# Patient Record
Sex: Female | Born: 1960 | Race: Black or African American | Hispanic: No | Marital: Single | Smoking: Never smoker
Health system: Southern US, Community
[De-identification: ages and names within clinical notes are randomized; demographics above are authoritative.]

## PROBLEM LIST (undated history)

## (undated) DIAGNOSIS — E119 Type 2 diabetes mellitus without complications: Secondary | ICD-10-CM

## (undated) DIAGNOSIS — L439 Lichen planus, unspecified: Secondary | ICD-10-CM

## (undated) DIAGNOSIS — M1990 Unspecified osteoarthritis, unspecified site: Secondary | ICD-10-CM

## (undated) DIAGNOSIS — E1110 Type 2 diabetes mellitus with ketoacidosis without coma: Secondary | ICD-10-CM

## (undated) HISTORY — PX: COLONOSCOPY: SHX174

## (undated) SURGERY — ARTHROPLASTY, HIP, TOTAL, ANTERIOR APPROACH
Anesthesia: Choice | Laterality: Right

---

## 2014-08-09 HISTORY — PX: KNEE ARTHROSCOPY: SHX127

## 2014-11-20 DIAGNOSIS — E1110 Type 2 diabetes mellitus with ketoacidosis without coma: Secondary | ICD-10-CM

## 2014-11-20 HISTORY — DX: Type 2 diabetes mellitus with ketoacidosis without coma: E11.10

## 2015-04-13 ENCOUNTER — Other Ambulatory Visit: Payer: Self-pay | Admitting: Orthopedic Surgery

## 2015-04-24 ENCOUNTER — Encounter (HOSPITAL_COMMUNITY): Payer: Self-pay

## 2015-04-24 ENCOUNTER — Encounter (HOSPITAL_COMMUNITY)
Admission: RE | Admit: 2015-04-24 | Discharge: 2015-04-24 | Disposition: A | Discharge status: Home / Self Care | Payer: 59 | Source: Ambulatory Visit | Attending: Orthopedic Surgery | Admitting: Orthopedic Surgery

## 2015-04-24 DIAGNOSIS — M1611 Unilateral primary osteoarthritis, right hip: Secondary | ICD-10-CM | POA: Diagnosis not present

## 2015-04-24 DIAGNOSIS — Z01812 Encounter for preprocedural laboratory examination: Secondary | ICD-10-CM | POA: Insufficient documentation

## 2015-04-24 DIAGNOSIS — E119 Type 2 diabetes mellitus without complications: Secondary | ICD-10-CM | POA: Diagnosis not present

## 2015-04-24 DIAGNOSIS — Z0183 Encounter for blood typing: Secondary | ICD-10-CM | POA: Insufficient documentation

## 2015-04-24 DIAGNOSIS — Z79899 Other long term (current) drug therapy: Secondary | ICD-10-CM | POA: Diagnosis not present

## 2015-04-24 DIAGNOSIS — Z794 Long term (current) use of insulin: Secondary | ICD-10-CM | POA: Diagnosis not present

## 2015-04-24 DIAGNOSIS — Z01818 Encounter for other preprocedural examination: Secondary | ICD-10-CM | POA: Insufficient documentation

## 2015-04-24 HISTORY — DX: Unspecified osteoarthritis, unspecified site: M19.90

## 2015-04-24 HISTORY — DX: Type 2 diabetes mellitus with ketoacidosis without coma: E11.10

## 2015-04-24 HISTORY — DX: Type 2 diabetes mellitus without complications: E11.9

## 2015-04-24 HISTORY — DX: Lichen planus, unspecified: L43.9

## 2015-04-24 LAB — CBC WITH DIFFERENTIAL/PLATELET
BASOS ABS: 0.1 10*3/uL (ref 0.0–0.1)
Basophils Relative: 1 %
Eosinophils Absolute: 0.3 10*3/uL (ref 0.0–0.7)
Eosinophils Relative: 3 %
HEMATOCRIT: 44.5 % (ref 36.0–46.0)
HEMOGLOBIN: 14.3 g/dL (ref 12.0–15.0)
LYMPHS ABS: 2.9 10*3/uL (ref 0.7–4.0)
LYMPHS PCT: 26 %
MCH: 25.6 pg — AB (ref 26.0–34.0)
MCHC: 32.1 g/dL (ref 30.0–36.0)
MCV: 79.7 fL (ref 78.0–100.0)
Monocytes Absolute: 0.6 10*3/uL (ref 0.1–1.0)
Monocytes Relative: 6 %
NEUTROS ABS: 7.1 10*3/uL (ref 1.7–7.7)
NEUTROS PCT: 64 %
PLATELETS: 329 10*3/uL (ref 150–400)
RBC: 5.58 MIL/uL — AB (ref 3.87–5.11)
RDW: 15.6 % — ABNORMAL HIGH (ref 11.5–15.5)
WBC: 10.9 10*3/uL — AB (ref 4.0–10.5)

## 2015-04-24 LAB — COMPREHENSIVE METABOLIC PANEL
ALK PHOS: 82 U/L (ref 38–126)
ALT: 19 U/L (ref 14–54)
AST: 18 U/L (ref 15–41)
Albumin: 4 g/dL (ref 3.5–5.0)
Anion gap: 9 (ref 5–15)
BUN: 17 mg/dL (ref 6–20)
CALCIUM: 10.5 mg/dL — AB (ref 8.9–10.3)
CHLORIDE: 106 mmol/L (ref 101–111)
CO2: 26 mmol/L (ref 22–32)
CREATININE: 0.73 mg/dL (ref 0.44–1.00)
Glucose, Bld: 151 mg/dL — ABNORMAL HIGH (ref 65–99)
Potassium: 4.2 mmol/L (ref 3.5–5.1)
Sodium: 141 mmol/L (ref 135–145)
Total Bilirubin: 0.4 mg/dL (ref 0.3–1.2)
Total Protein: 7 g/dL (ref 6.5–8.1)

## 2015-04-24 LAB — URINE MICROSCOPIC-ADD ON: Bacteria, UA: NONE SEEN

## 2015-04-24 LAB — TYPE AND SCREEN
ABO/RH(D): O POS
Antibody Screen: NEGATIVE

## 2015-04-24 LAB — ABO/RH: ABO/RH(D): O POS

## 2015-04-24 LAB — URINALYSIS, ROUTINE W REFLEX MICROSCOPIC
Bilirubin Urine: NEGATIVE
HGB URINE DIPSTICK: NEGATIVE
KETONES UR: NEGATIVE mg/dL
Leukocytes, UA: NEGATIVE
Nitrite: NEGATIVE
PROTEIN: NEGATIVE mg/dL
Specific Gravity, Urine: 1.026 (ref 1.005–1.030)
pH: 7 (ref 5.0–8.0)

## 2015-04-24 LAB — GLUCOSE, CAPILLARY: GLUCOSE-CAPILLARY: 140 mg/dL — AB (ref 65–99)

## 2015-04-24 LAB — PROTIME-INR
INR: 0.94 (ref 0.00–1.49)
PROTHROMBIN TIME: 12.8 s (ref 11.6–15.2)

## 2015-04-24 LAB — APTT: APTT: 33 s (ref 24–37)

## 2015-04-24 LAB — SURGICAL PCR SCREEN
MRSA, PCR: NEGATIVE
Staphylococcus aureus: NEGATIVE

## 2015-04-24 NOTE — Progress Notes (Signed)
Ms April Townsend reports that she only checks CBGs in am- run 110- 140.  [place] in [place] with D[place]. Patient reports that she had a new oral diabetic medication added after that.  I encouraged patient to check CBG in the upcoming days prior to surgery and if any big change from what she reported today (110- 140 to call Dr Sharl MaKerr.  Patient denies chest pain or shortness of breath.  Patient had an echo and stress test in 2014- "I was having chest discomfort, the test was normal. They think it was muscle spasms or stress.

## 2015-04-24 NOTE — Pre-Procedure Instructions (Signed)
April Townsend  04/24/2015    Your procedure is scheduled on Friday, March 17.  Report to [facility] Admitting at 7:15 A.M.                Your surgery or procedure is scheduled for 9:15 AM   Call this number if you have problems the morning of surgery:[phone]                       For any other questions, please call [phone], Monday - Friday 8 AM - 4 PM.   Remember:  Do not eat food or drink liquids after midnight Thursday               Take these medicines the morning of surgery with A SIP OF WATER :  None                         How to Manage Your Diabetes Before Surgery  What do I do about my diabetes medications?  Do not take oral diabetes medicines (pills) the morning of surgery.   THE MORNING OF SURGERY, take 37 units of  Toujeo  Insulin.   Do not take other diabetes injectables the day of surgery including Byetta, Victoza, Bydureon, and Trulicity.  Why is it important to control my blood sugar before and after surgery?   Improving blood sugar levels before and after surgery helps healing and can limit problems.  A way of improving blood sugar control is eating a healthy diet by:  - Eating less sugar and carbohydrates  - Increasing activity/exercise  - Talk with your doctor about reaching your blood sugar goals  High blood sugars (greater than 180 mg/dL) can raise your risk of infections and slow down your recovery so you will need to focus on controlling your diabetes during the weeks before surgery.  Make sure that the doctor who takes care of your diabetes knows about your planned surgery including the date and location.  How do I manage my blood sugars before surgery?   Check your blood sugar at least 4 times a day, 2 days before surgery to make sure that they are not too high or low.   Check your blood sugar the morning of your surgery when you wake up and every 2  hours until you get to the Short-Stay unit.  If your blood sugar is  less than 70 mg/dL, you will need to treat for low blood sugar by:  Treat a low blood sugar (less than 70 mg/dL) with 1/2 cup of clear juice (cranberry or apple), 4 glucose tablets, OR glucose gel.  Recheck blood sugar in 15 minutes after treatment (to make sure it is greater than 70 mg/dL).  If blood sugar is not greater than 70 mg/dL on re-check, call [phone] for further instructions.   Report your blood sugar to the Short-Stay nurse when you get to Short-Stay.  References:  [facility], 2007 "How to Manage your Diabetes Before and After Surgery".    Do not wear jewelry, make-up or nail polish.  Do not wear lotions, powders, or perfumes.    Do not shave 48 hours prior to surgery.  Do not bring valuables to the hospital.  [facility] is not responsible for any belongings or valuables.  Contacts, dentures or bridgework may not be worn into surgery.  Leave your suitcase in the car.  After surgery  it may be brought to your room.  For patients admitted to the hospital, discharge time will be determined by your treatment team.  Special instructions:  Review  [place] - Preparing For Surgery.   Please read over the following fact sheets that you were given. Pain Booklet, Coughing and Deep Breathing, Blood Transfusion Information, MRSA Information and Surgical Site Infection Prevention  Incentive Spirometery

## 2015-04-25 NOTE — Progress Notes (Addendum)
Anesthesia Chart Review:  Pt is a 55 year old female scheduled for R total hip arthroplasty anterior approach on 05/04/2015 with Dr. Luiz BlareGraves.   PCP is Dr. Karyl KinnierArlene Ramos who has cleared pt medically for surgery (care everywhere note 04/13/15)  PMH includes:  DM (DKA 11/2014). Never smoker. BMI 32.5  Medications include: farxiga, toujeo, lisinopril, sitagliptin  Preoperative labs reviewed. Hgb A1c was 8.4 on 04/06/15 (care everywhere).   Chest x-ray report 11/20/14 reviewed (care everywhere). No acute cardiopulmonary disease.   EKG 04/24/15: NSR. Cannot rule out Anterior infarct, age undetermined. Low R V3 of unknown significance per Dr. Thomasene LotGreen's interpretation.   By notes, pt saw cardiologist Dr. Gwen HerUsman Khawaja (care everywhere) for atypical chest pain. Stress test and echo normal. Have requested copies of tests for our records.   Rica Mastngela Colleene Swarthout, FNP-BC [facility]/Anesthesiology Phone: [phone] 04/25/2015 11:55 AM  Addendum:   Echo 10/06/12:  - EF >55% - LV wall motion normal - Borderline MVP - Mild tricuspid regurgitation  Nuclear stress test 09/15/12:  - No inducible myocardial ischemia.  - EF 58%. Global LV systolic function normal   If no changes, I anticipate pt can proceed with surgery as scheduled.   Rica Mastngela Jona Zappone, FNP-BC [facility]/Anesthesiology Phone: [phone] 04/30/2015 12:55 PM

## 2015-04-27 NOTE — Progress Notes (Signed)
Message left with [facility] medical records re-requesting stress test and echo.

## 2015-05-03 MED ORDER — CLINDAMYCIN PHOSPHATE 900 MG/50ML IV SOLN
900.0000 mg | INTRAVENOUS | Status: AC
  Administered 2015-05-04: 900 mg via INTRAVENOUS
  Filled 2015-05-03: qty 50

## 2015-05-04 ENCOUNTER — Inpatient Hospital Stay (HOSPITAL_COMMUNITY): Payer: 59 | Admitting: Anesthesiology

## 2015-05-04 ENCOUNTER — Encounter (HOSPITAL_COMMUNITY)
Admission: RE | Disposition: A | Discharge status: Home Health | Payer: Self-pay | Source: Ambulatory Visit | Attending: Orthopedic Surgery

## 2015-05-04 ENCOUNTER — Inpatient Hospital Stay (HOSPITAL_COMMUNITY): Payer: 59

## 2015-05-04 ENCOUNTER — Encounter (HOSPITAL_COMMUNITY): Payer: Self-pay | Admitting: *Deleted

## 2015-05-04 ENCOUNTER — Inpatient Hospital Stay (HOSPITAL_COMMUNITY)
Admission: RE | Admit: 2015-05-04 | Discharge: 2015-05-05 | DRG: 470 | Disposition: A | Discharge status: Home Health | Payer: 59 | Source: Ambulatory Visit | Attending: Orthopedic Surgery | Admitting: Orthopedic Surgery

## 2015-05-04 ENCOUNTER — Inpatient Hospital Stay (HOSPITAL_COMMUNITY): Payer: 59 | Admitting: Emergency Medicine

## 2015-05-04 DIAGNOSIS — Z419 Encounter for procedure for purposes other than remedying health state, unspecified: Secondary | ICD-10-CM

## 2015-05-04 DIAGNOSIS — I10 Essential (primary) hypertension: Secondary | ICD-10-CM | POA: Diagnosis present

## 2015-05-04 DIAGNOSIS — D62 Acute posthemorrhagic anemia: Secondary | ICD-10-CM | POA: Diagnosis not present

## 2015-05-04 DIAGNOSIS — Z794 Long term (current) use of insulin: Secondary | ICD-10-CM

## 2015-05-04 DIAGNOSIS — E119 Type 2 diabetes mellitus without complications: Secondary | ICD-10-CM | POA: Diagnosis present

## 2015-05-04 DIAGNOSIS — M247 Protrusio acetabuli: Secondary | ICD-10-CM | POA: Diagnosis present

## 2015-05-04 DIAGNOSIS — Z01818 Encounter for other preprocedural examination: Secondary | ICD-10-CM

## 2015-05-04 DIAGNOSIS — M1611 Unilateral primary osteoarthritis, right hip: Secondary | ICD-10-CM | POA: Diagnosis present

## 2015-05-04 HISTORY — PX: TOTAL HIP ARTHROPLASTY: SHX124

## 2015-05-04 LAB — GLUCOSE, CAPILLARY
GLUCOSE-CAPILLARY: 141 mg/dL — AB (ref 65–99)
GLUCOSE-CAPILLARY: 55 mg/dL — AB (ref 65–99)
Glucose-Capillary: 104 mg/dL — ABNORMAL HIGH (ref 65–99)
Glucose-Capillary: 130 mg/dL — ABNORMAL HIGH (ref 65–99)
Glucose-Capillary: 135 mg/dL — ABNORMAL HIGH (ref 65–99)

## 2015-05-04 SURGERY — ARTHROPLASTY, HIP, TOTAL, ANTERIOR APPROACH
Anesthesia: Spinal | Laterality: Right

## 2015-05-04 MED ORDER — BISACODYL 5 MG PO TBEC: 5.0000 mg | DELAYED_RELEASE_TABLET | Freq: Every day | ORAL | Status: DC | PRN

## 2015-05-04 MED ORDER — TRANEXAMIC ACID 1000 MG/10ML IV SOLN
1000.0000 mg | Freq: Once | INTRAVENOUS | Status: AC
  Administered 2015-05-04: 1000 mg via INTRAVENOUS
  Filled 2015-05-04: qty 10

## 2015-05-04 MED ORDER — HYDROMORPHONE HCL 1 MG/ML IJ SOLN
0.5000 mg | INTRAMUSCULAR | Status: DC | PRN
Start: 2015-05-04 — End: 2015-05-05

## 2015-05-04 MED ORDER — ACETAMINOPHEN 650 MG RE SUPP: 650.0000 mg | Freq: Four times a day (QID) | RECTAL | Status: DC | PRN

## 2015-05-04 MED ORDER — ASPIRIN EC 325 MG PO TBEC: 325.0000 mg | DELAYED_RELEASE_TABLET | Freq: Two times a day (BID) | ORAL | Status: AC

## 2015-05-04 MED ORDER — OXYCODONE HCL 5 MG PO TABS
5.0000 mg | ORAL_TABLET | ORAL | Status: DC | PRN
  Administered 2015-05-04 – 2015-05-05 (×3): 10 mg via ORAL
  Filled 2015-05-04 (×3): qty 2

## 2015-05-04 MED ORDER — DOCUSATE SODIUM 100 MG PO CAPS
100.0000 mg | ORAL_CAPSULE | Freq: Two times a day (BID) | ORAL | Status: DC
  Administered 2015-05-04 – 2015-05-05 (×3): 100 mg via ORAL
  Filled 2015-05-04 (×3): qty 1

## 2015-05-04 MED ORDER — ESMOLOL HCL 100 MG/10ML IV SOLN
INTRAVENOUS | Status: DC | PRN
Start: 2015-05-04 — End: 2015-05-04
  Administered 2015-05-04 (×3): 10 mg via INTRAVENOUS

## 2015-05-04 MED ORDER — MAGNESIUM CITRATE PO SOLN: 1.0000 | Freq: Once | ORAL | Status: DC | PRN

## 2015-05-04 MED ORDER — HYDROMORPHONE HCL 1 MG/ML IJ SOLN
INTRAMUSCULAR | Status: AC
  Filled 2015-05-04: qty 1

## 2015-05-04 MED ORDER — LIDOCAINE HCL (CARDIAC) 20 MG/ML IV SOLN
INTRAVENOUS | Status: AC
  Filled 2015-05-04: qty 5

## 2015-05-04 MED ORDER — MIDAZOLAM HCL 2 MG/2ML IJ SOLN
INTRAMUSCULAR | Status: AC
  Filled 2015-05-04: qty 2

## 2015-05-04 MED ORDER — LABETALOL HCL 5 MG/ML IV SOLN
INTRAVENOUS | Status: AC
  Filled 2015-05-04: qty 4

## 2015-05-04 MED ORDER — LINAGLIPTIN 5 MG PO TABS
5.0000 mg | ORAL_TABLET | Freq: Every day | ORAL | Status: DC
  Administered 2015-05-04 – 2015-05-05 (×2): 5 mg via ORAL
  Filled 2015-05-04 (×2): qty 1

## 2015-05-04 MED ORDER — LABETALOL HCL 5 MG/ML IV SOLN: 10.0000 mg | INTRAVENOUS | Status: DC | PRN

## 2015-05-04 MED ORDER — BUPIVACAINE HCL 0.5 % IJ SOLN
INTRAMUSCULAR | Status: DC | PRN
  Administered 2015-05-04: 20 mL

## 2015-05-04 MED ORDER — INSULIN GLARGINE 100 UNIT/ML ~~LOC~~ SOLN
37.0000 [IU] | Freq: Every day | SUBCUTANEOUS | Status: DC
Start: 2015-05-05 — End: 2015-05-05
  Administered 2015-05-05: 37 [IU] via SUBCUTANEOUS
  Filled 2015-05-04: qty 0.37

## 2015-05-04 MED ORDER — LACTATED RINGERS IV SOLN
INTRAVENOUS | Status: DC
  Administered 2015-05-04 (×3): via INTRAVENOUS

## 2015-05-04 MED ORDER — SODIUM CHLORIDE 0.9 % IV SOLN: INTRAVENOUS | Status: DC

## 2015-05-04 MED ORDER — INSULIN ASPART 100 UNIT/ML ~~LOC~~ SOLN
0.0000 [IU] | Freq: Three times a day (TID) | SUBCUTANEOUS | Status: DC
  Administered 2015-05-04: 2 [IU] via SUBCUTANEOUS

## 2015-05-04 MED ORDER — CANAGLIFLOZIN 100 MG PO TABS
100.0000 mg | ORAL_TABLET | Freq: Every day | ORAL | Status: DC
  Administered 2015-05-05: 100 mg via ORAL
  Filled 2015-05-04: qty 1

## 2015-05-04 MED ORDER — ASPIRIN EC 325 MG PO TBEC
325.0000 mg | DELAYED_RELEASE_TABLET | Freq: Two times a day (BID) | ORAL | Status: DC
  Administered 2015-05-04 – 2015-05-05 (×2): 325 mg via ORAL
  Filled 2015-05-04 (×2): qty 1

## 2015-05-04 MED ORDER — ALUM & MAG HYDROXIDE-SIMETH 200-200-20 MG/5ML PO SUSP: 30.0000 mL | ORAL | Status: DC | PRN

## 2015-05-04 MED ORDER — MIDAZOLAM HCL 5 MG/5ML IJ SOLN
INTRAMUSCULAR | Status: DC | PRN
  Administered 2015-05-04: 2 mg via INTRAVENOUS

## 2015-05-04 MED ORDER — ESMOLOL HCL 100 MG/10ML IV SOLN
INTRAVENOUS | Status: AC
  Filled 2015-05-04: qty 10

## 2015-05-04 MED ORDER — BUPIVACAINE IN DEXTROSE 0.75-8.25 % IT SOLN
INTRATHECAL | Status: DC | PRN
  Administered 2015-05-04: 2 mL via INTRATHECAL

## 2015-05-04 MED ORDER — PHENYLEPHRINE HCL 10 MG/ML IJ SOLN
10.0000 mg | INTRAVENOUS | Status: DC | PRN
  Administered 2015-05-04: 10 ug/min via INTRAVENOUS

## 2015-05-04 MED ORDER — KETOROLAC TROMETHAMINE 15 MG/ML IJ SOLN
15.0000 mg | Freq: Three times a day (TID) | INTRAMUSCULAR | Status: AC
  Administered 2015-05-04 – 2015-05-05 (×4): 15 mg via INTRAVENOUS
  Filled 2015-05-04 (×4): qty 1

## 2015-05-04 MED ORDER — PROPOFOL 500 MG/50ML IV EMUL
INTRAVENOUS | Status: DC | PRN
  Administered 2015-05-04: 50 ug/kg/min via INTRAVENOUS

## 2015-05-04 MED ORDER — ROCURONIUM BROMIDE 50 MG/5ML IV SOLN
INTRAVENOUS | Status: AC
  Filled 2015-05-04: qty 1

## 2015-05-04 MED ORDER — INSULIN GLARGINE 300 UNIT/ML ~~LOC~~ SOPN: 37.0000 [IU] | PEN_INJECTOR | Freq: Every day | SUBCUTANEOUS | Status: DC

## 2015-05-04 MED ORDER — LACTATED RINGERS IV SOLN: INTRAVENOUS | Status: DC

## 2015-05-04 MED ORDER — LISINOPRIL 20 MG PO TABS
20.0000 mg | ORAL_TABLET | Freq: Every day | ORAL | Status: DC
  Administered 2015-05-04 – 2015-05-05 (×2): 20 mg via ORAL
  Filled 2015-05-04 (×2): qty 1

## 2015-05-04 MED ORDER — LIDOCAINE HCL (CARDIAC) 20 MG/ML IV SOLN
INTRAVENOUS | Status: DC | PRN
  Administered 2015-05-04: 60 mg via INTRAVENOUS

## 2015-05-04 MED ORDER — ALBUMIN HUMAN 5 % IV SOLN
INTRAVENOUS | Status: DC | PRN
  Administered 2015-05-04: 10:00:00 via INTRAVENOUS

## 2015-05-04 MED ORDER — DIPHENHYDRAMINE HCL 12.5 MG/5ML PO ELIX: 12.5000 mg | ORAL_SOLUTION | ORAL | Status: DC | PRN

## 2015-05-04 MED ORDER — 0.9 % SODIUM CHLORIDE (POUR BTL) OPTIME
TOPICAL | Status: DC | PRN
Start: 2015-05-04 — End: 2015-05-04
  Administered 2015-05-04: 1000 mL

## 2015-05-04 MED ORDER — PROMETHAZINE HCL 25 MG/ML IJ SOLN: 6.2500 mg | INTRAMUSCULAR | Status: DC | PRN

## 2015-05-04 MED ORDER — BUPIVACAINE HCL (PF) 0.5 % IJ SOLN
INTRAMUSCULAR | Status: AC
  Filled 2015-05-04: qty 30

## 2015-05-04 MED ORDER — ZOLPIDEM TARTRATE 5 MG PO TABS: 5.0000 mg | ORAL_TABLET | Freq: Every evening | ORAL | Status: DC | PRN

## 2015-05-04 MED ORDER — PROPOFOL 10 MG/ML IV BOLUS
INTRAVENOUS | Status: DC | PRN
  Administered 2015-05-04 (×3): 10 mg via INTRAVENOUS

## 2015-05-04 MED ORDER — HYDROMORPHONE HCL 1 MG/ML IJ SOLN
0.2500 mg | INTRAMUSCULAR | Status: DC | PRN
  Administered 2015-05-04: 0.5 mg via INTRAVENOUS

## 2015-05-04 MED ORDER — TRANEXAMIC ACID 1000 MG/10ML IV SOLN
1000.0000 mg | INTRAVENOUS | Status: AC
  Administered 2015-05-04: 1000 mg via INTRAVENOUS
  Filled 2015-05-04: qty 10

## 2015-05-04 MED ORDER — OXYCODONE-ACETAMINOPHEN 5-325 MG PO TABS: 1.0000 | ORAL_TABLET | ORAL | Status: AC | PRN

## 2015-05-04 MED ORDER — POLYETHYLENE GLYCOL 3350 17 G PO PACK: 17.0000 g | PACK | Freq: Every day | ORAL | Status: DC | PRN

## 2015-05-04 MED ORDER — PROPOFOL 10 MG/ML IV BOLUS
INTRAVENOUS | Status: AC
  Filled 2015-05-04: qty 20

## 2015-05-04 MED ORDER — ONDANSETRON HCL 4 MG/2ML IJ SOLN
4.0000 mg | Freq: Four times a day (QID) | INTRAMUSCULAR | Status: DC | PRN
  Administered 2015-05-04: 4 mg via INTRAVENOUS
  Filled 2015-05-04: qty 2

## 2015-05-04 MED ORDER — METHOCARBAMOL 500 MG PO TABS
500.0000 mg | ORAL_TABLET | Freq: Four times a day (QID) | ORAL | Status: DC | PRN
  Administered 2015-05-05: 500 mg via ORAL
  Filled 2015-05-04: qty 1

## 2015-05-04 MED ORDER — CHLORHEXIDINE GLUCONATE 4 % EX LIQD: 60.0000 mL | Freq: Once | CUTANEOUS | Status: DC

## 2015-05-04 MED ORDER — TIZANIDINE HCL 2 MG PO TABS: 2.0000 mg | ORAL_TABLET | Freq: Three times a day (TID) | ORAL | Status: AC | PRN

## 2015-05-04 MED ORDER — ONDANSETRON HCL 4 MG PO TABS: 4.0000 mg | ORAL_TABLET | Freq: Four times a day (QID) | ORAL | Status: DC | PRN

## 2015-05-04 MED ORDER — BUPIVACAINE LIPOSOME 1.3 % IJ SUSP
20.0000 mL | INTRAMUSCULAR | Status: AC
  Administered 2015-05-04: 20 mL
  Filled 2015-05-04: qty 20

## 2015-05-04 MED ORDER — SUCCINYLCHOLINE CHLORIDE 20 MG/ML IJ SOLN
INTRAMUSCULAR | Status: AC
  Filled 2015-05-04: qty 1

## 2015-05-04 MED ORDER — METHOCARBAMOL 1000 MG/10ML IJ SOLN
500.0000 mg | Freq: Four times a day (QID) | INTRAVENOUS | Status: DC | PRN
  Filled 2015-05-04: qty 5

## 2015-05-04 MED ORDER — FENTANYL CITRATE (PF) 100 MCG/2ML IJ SOLN
INTRAMUSCULAR | Status: DC | PRN
  Administered 2015-05-04 (×5): 50 ug via INTRAVENOUS

## 2015-05-04 MED ORDER — CLINDAMYCIN PHOSPHATE 600 MG/50ML IV SOLN
600.0000 mg | Freq: Four times a day (QID) | INTRAVENOUS | Status: AC
  Administered 2015-05-04 (×2): 600 mg via INTRAVENOUS
  Filled 2015-05-04 (×2): qty 50

## 2015-05-04 MED ORDER — FENTANYL CITRATE (PF) 250 MCG/5ML IJ SOLN
INTRAMUSCULAR | Status: AC
  Filled 2015-05-04: qty 5

## 2015-05-04 MED ORDER — ACETAMINOPHEN 325 MG PO TABS: 650.0000 mg | ORAL_TABLET | Freq: Four times a day (QID) | ORAL | Status: DC | PRN

## 2015-05-04 MED ORDER — MEPERIDINE HCL 25 MG/ML IJ SOLN: 6.2500 mg | INTRAMUSCULAR | Status: DC | PRN

## 2015-05-04 SURGICAL SUPPLY — 66 items
BENZOIN TINCTURE PRP APPL 2/3 (GAUZE/BANDAGES/DRESSINGS) ×3 IMPLANT
BLADE SAW SGTL 18X1.27X75 (BLADE) ×2 IMPLANT
BLADE SAW SGTL 18X1.27X75MM (BLADE) ×1
BLADE SURG ROTATE 9660 (MISCELLANEOUS) IMPLANT
BNDG COHESIVE 6X5 TAN STRL LF (GAUZE/BANDAGES/DRESSINGS) IMPLANT
BNDG GAUZE ELAST 4 BULKY (GAUZE/BANDAGES/DRESSINGS) IMPLANT
CAPT HIP TOTAL 2 ×3 IMPLANT
CELLS DAT CNTRL 66122 CELL SVR (MISCELLANEOUS) ×1 IMPLANT
CLOSURE STERI-STRIP 1/2X4 (GAUZE/BANDAGES/DRESSINGS) ×1
CLOSURE WOUND 1/2 X4 (GAUZE/BANDAGES/DRESSINGS) ×1
CLSR STERI-STRIP ANTIMIC 1/2X4 (GAUZE/BANDAGES/DRESSINGS) ×2 IMPLANT
COVER PERINEAL POST (MISCELLANEOUS) ×3 IMPLANT
COVER SURGICAL LIGHT HANDLE (MISCELLANEOUS) ×6 IMPLANT
DRAPE C-ARM 42X72 X-RAY (DRAPES) ×3 IMPLANT
DRAPE STERI IOBAN 125X83 (DRAPES) ×3 IMPLANT
DRAPE U-SHAPE 47X51 STRL (DRAPES) ×6 IMPLANT
DRSG AQUACEL AG ADV 3.5X10 (GAUZE/BANDAGES/DRESSINGS) ×3 IMPLANT
DURAPREP 26ML APPLICATOR (WOUND CARE) ×3 IMPLANT
ELECT BLADE 4.0 EZ CLEAN MEGAD (MISCELLANEOUS)
ELECT CAUTERY BLADE 6.4 (BLADE) ×3 IMPLANT
ELECT REM PT RETURN 9FT ADLT (ELECTROSURGICAL) ×3
ELECTRODE BLDE 4.0 EZ CLN MEGD (MISCELLANEOUS) IMPLANT
ELECTRODE REM PT RTRN 9FT ADLT (ELECTROSURGICAL) ×1 IMPLANT
GAUZE XEROFORM 1X8 LF (GAUZE/BANDAGES/DRESSINGS) ×3 IMPLANT
GLOVE BIO SURGEON STRL SZ7 (GLOVE) ×3 IMPLANT
GLOVE BIOGEL PI IND STRL 6.5 (GLOVE) ×1 IMPLANT
GLOVE BIOGEL PI IND STRL 7.0 (GLOVE) ×1 IMPLANT
GLOVE BIOGEL PI IND STRL 8 (GLOVE) ×2 IMPLANT
GLOVE BIOGEL PI INDICATOR 6.5 (GLOVE) ×2
GLOVE BIOGEL PI INDICATOR 7.0 (GLOVE) ×2
GLOVE BIOGEL PI INDICATOR 8 (GLOVE) ×4
GLOVE ECLIPSE 7.5 STRL STRAW (GLOVE) ×9 IMPLANT
GLOVE SURG SS PI 6.5 STRL IVOR (GLOVE) ×3 IMPLANT
GOWN STRL REUS W/ TWL LRG LVL3 (GOWN DISPOSABLE) ×2 IMPLANT
GOWN STRL REUS W/ TWL XL LVL3 (GOWN DISPOSABLE) ×2 IMPLANT
GOWN STRL REUS W/TWL LRG LVL3 (GOWN DISPOSABLE) ×4
GOWN STRL REUS W/TWL XL LVL3 (GOWN DISPOSABLE) ×4
HOOD PEEL AWAY FACE SHEILD DIS (HOOD) ×6 IMPLANT
KIT BASIN OR (CUSTOM PROCEDURE TRAY) ×3 IMPLANT
KIT ROOM TURNOVER OR (KITS) ×3 IMPLANT
MANIFOLD NEPTUNE II (INSTRUMENTS) ×3 IMPLANT
NEEDLE SPNL 22GX3.5 QUINCKE BK (NEEDLE) ×3 IMPLANT
NS IRRIG 1000ML POUR BTL (IV SOLUTION) ×3 IMPLANT
PACK TOTAL JOINT (CUSTOM PROCEDURE TRAY) ×3 IMPLANT
PACK UNIVERSAL I (CUSTOM PROCEDURE TRAY) ×3 IMPLANT
PAD ARMBOARD 7.5X6 YLW CONV (MISCELLANEOUS) ×6 IMPLANT
RTRCTR WOUND ALEXIS 18CM MED (MISCELLANEOUS) ×3
RTRCTR WOUND ALEXIS 18CM SML (INSTRUMENTS)
SAVER CELL AAL HAEMONETICS (INSTRUMENTS) IMPLANT
SPONGE LAP 18X18 X RAY DECT (DISPOSABLE) IMPLANT
STAPLER VISISTAT 35W (STAPLE) IMPLANT
STRIP CLOSURE SKIN 1/2X4 (GAUZE/BANDAGES/DRESSINGS) ×2 IMPLANT
SUT ETHIBOND NAB CT1 #1 30IN (SUTURE) ×6 IMPLANT
SUT MNCRL AB 3-0 PS2 18 (SUTURE) IMPLANT
SUT VIC AB 0 CT1 27 (SUTURE) ×2
SUT VIC AB 0 CT1 27XBRD ANBCTR (SUTURE) ×1 IMPLANT
SUT VIC AB 1 CT1 27 (SUTURE) ×4
SUT VIC AB 1 CT1 27XBRD ANBCTR (SUTURE) ×2 IMPLANT
SUT VIC AB 2-0 CT1 27 (SUTURE) ×2
SUT VIC AB 2-0 CT1 TAPERPNT 27 (SUTURE) ×1 IMPLANT
SYR 50ML LL SCALE MARK (SYRINGE) ×3 IMPLANT
TOWEL OR 17X24 6PK STRL BLUE (TOWEL DISPOSABLE) ×3 IMPLANT
TOWEL OR 17X26 10 PK STRL BLUE (TOWEL DISPOSABLE) ×3 IMPLANT
TRAY CATH 16FR W/PLASTIC CATH (SET/KITS/TRAYS/PACK) ×3 IMPLANT
TRAY FOLEY CATH 16FR SILVER (SET/KITS/TRAYS/PACK) IMPLANT
WATER STERILE IRR 1000ML POUR (IV SOLUTION) ×6 IMPLANT

## 2015-05-04 NOTE — H&P (Signed)
TOTAL HIP ADMISSION H&P  Patient is admitted for right total hip arthroplasty.  Subjective:  Chief Complaint: right hip pain  HPI: April Townsend, 55 y.o. female, has a history of pain and functional disability in the right hip(s) due to arthritis and patient has failed non-surgical conservative treatments for greater than 12 weeks to include NSAID's and/or analgesics, use of assistive devices, weight reduction as appropriate and activity modification.  Onset of symptoms was gradual starting 5 years ago with gradually worsening course since that time.The patient noted no past surgery on the right hip(s).  Patient currently rates pain in the right hip at 8 out of 10 with activity. Patient has night pain, worsening of pain with activity and weight bearing, trendelenberg gait, pain that interfers with activities of daily living, pain with passive range of motion, crepitus and joint swelling. 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 This condition presents safety issues increasing the risk of falls. This patient has had failure of all reasonable conservative care.  There is no current active infection.  There are no active problems to display for this patient.  Past Medical History  Diagnosis Date  . Arthritis   . Lichen planus atrophicus   . Diabetes mellitus without complication ([place])     Type II  . DKA (diabetic ketoacidoses) ([place]) 11/20/14    Past Surgical History  Procedure Laterality Date  . Knee arthroscopy  08/09/2014  . Colonoscopy      Prescriptions prior to admission  Medication Sig Dispense Refill Last Dose  . dapagliflozin propanediol (FARXIGA) 10 MG TABS tablet Take 10 mg by mouth daily.     . Insulin Glargine (TOUJEO SOLOSTAR) 300 UNIT/ML SOPN Inject 76 Units into the skin daily.     Marland Kitchen lisinopril (PRINIVIL,ZESTRIL) 20 MG tablet Take 20 mg by mouth daily.     .  sitaGLIPtin (JANUVIA) 100 MG tablet Take 100 mg by mouth daily.      Allergies  Allergen Reactions  . Fish Allergy Anaphylaxis, Swelling and Hives  . Penicillins Anaphylaxis, Shortness Of Breath and Swelling    Throat swells up  . Metformin And Related Nausea And Vomiting    Sick on stomach    Social History  Substance Use Topics  . Smoking status: Never Smoker   . Smokeless tobacco: Not on file  . Alcohol Use: No    No family history on file.   ROS ROS: I have reviewed the patient's review of systems thoroughly and there are no positive responses as relates to the HPI. Objective:  Physical Exam  Vital signs in last 24 hours:   Well-developed well-nourished patient in no acute distress. Alert and oriented x3 HEENT:within normal limits Cardiac: Regular rate and rhythm Pulmonary: Lungs clear to auscultation Abdomen: Soft and nontender.  Normal active bowel sounds  Musculoskeletal: right hip: Painful range of motion.  Limited range of motion.  Neurovascularly intact distally. Labs: Recent Results (from the past 2160 hour(s))  Glucose, capillary     Status: Abnormal   Collection Time: 04/24/15 10:49 AM  Result Value Ref Range   Glucose-Capillary 140 (H) 65 - 99 mg/dL   Comment 1 Notify RN    Comment 2 Document in Chart   APTT     Status: None   Collection Time: 04/24/15 12:14 PM  Result Value Ref Range   aPTT 33 24 - 37 seconds  CBC WITH DIFFERENTIAL     Status: Abnormal  Collection Time: 04/24/15 12:14 PM  Result Value Ref Range   WBC 10.9 (H) 4.0 - 10.5 K/uL   RBC 5.58 (H) 3.87 - 5.11 MIL/uL   Hemoglobin 14.3 12.0 - 15.0 g/dL   HCT 44.5 36.0 - 46.0 %   MCV 79.7 78.0 - 100.0 fL   MCH 25.6 (L) 26.0 - 34.0 pg   MCHC 32.1 30.0 - 36.0 g/dL   RDW 15.6 (H) 11.5 - 15.5 %   Platelets 329 150 - 400 K/uL   Neutrophils Relative % 64 %   Neutro Abs 7.1 1.7 - 7.7 K/uL   Lymphocytes Relative 26 %   Lymphs Abs 2.9 0.7 - 4.0 K/uL   Monocytes Relative 6 %   Monocytes  Absolute 0.6 0.1 - 1.0 K/uL   Eosinophils Relative 3 %   Eosinophils Absolute 0.3 0.0 - 0.7 K/uL   Basophils Relative 1 %   Basophils Absolute 0.1 0.0 - 0.1 K/uL  Comprehensive metabolic panel     Status: Abnormal   Collection Time: 04/24/15 12:14 PM  Result Value Ref Range   Sodium 141 135 - 145 mmol/L   Potassium 4.2 3.5 - 5.1 mmol/L   Chloride 106 101 - 111 mmol/L   CO2 26 22 - 32 mmol/L   Glucose, Bld 151 (H) 65 - 99 mg/dL   BUN 17 6 - 20 mg/dL   Creatinine, Ser 0.73 0.44 - 1.00 mg/dL   Calcium 10.5 (H) 8.9 - 10.3 mg/dL   Total Protein 7.0 6.5 - 8.1 g/dL   Albumin 4.0 3.5 - 5.0 g/dL   AST 18 15 - 41 U/L   ALT 19 14 - 54 U/L   Alkaline Phosphatase 82 38 - 126 U/L   Total Bilirubin 0.4 0.3 - 1.2 mg/dL   GFR calc non Af Amer >60 >60 mL/min   GFR calc Af Amer >60 >60 mL/min    Comment: (NOTE) The eGFR has been calculated using the CKD EPI equation. This calculation has not been validated in all clinical situations. eGFR's persistently <60 mL/min signify possible Chronic Kidney Disease.    Anion gap 9 5 - 15  Protime-INR     Status: None   Collection Time: 04/24/15 12:14 PM  Result Value Ref Range   Prothrombin Time 12.8 11.6 - 15.2 seconds   INR 0.94 0.00 - 1.49  Urinalysis, Routine w reflex microscopic (not at [facility])     Status: Abnormal   Collection Time: 04/24/15 12:14 PM  Result Value Ref Range   Color, Urine YELLOW YELLOW   APPearance CLEAR CLEAR   Specific Gravity, Urine 1.026 1.005 - 1.030   pH 7.0 5.0 - 8.0   Glucose, UA >1000 (A) NEGATIVE mg/dL   Hgb urine dipstick NEGATIVE NEGATIVE   Bilirubin Urine NEGATIVE NEGATIVE   Ketones, ur NEGATIVE NEGATIVE mg/dL   Protein, ur NEGATIVE NEGATIVE mg/dL   Nitrite NEGATIVE NEGATIVE   Leukocytes, UA NEGATIVE NEGATIVE  Surgical pcr screen     Status: None   Collection Time: 04/24/15 12:14 PM  Result Value Ref Range   MRSA, PCR NEGATIVE NEGATIVE   Staphylococcus aureus NEGATIVE NEGATIVE    Comment:        The Xpert SA  Assay (FDA approved for NASAL specimens in patients over 28 years of age), is one component of a comprehensive surveillance program.  Test performance has been validated by [facility] for patients greater than or equal to 70 year old. It is not intended to diagnose infection nor to  guide or monitor treatment.   Urine microscopic-add on     Status: Abnormal   Collection Time: 04/24/15 12:14 PM  Result Value Ref Range   Squamous Epithelial / LPF 0-5 (A) NONE SEEN   WBC, UA 0-5 0 - 5 WBC/hpf   RBC / HPF 0-5 0 - 5 RBC/hpf   Bacteria, UA NONE SEEN NONE SEEN  Type and screen Order type and screen if day of surgery is less than 15 days from draw of preadmission visit or order morning of surgery if day of surgery is greater than 6 days from preadmission visit.     Status: None   Collection Time: 04/24/15 12:20 PM  Result Value Ref Range   ABO/RH(D) O POS    Antibody Screen NEG    Sample Expiration 05/08/2015    Extend sample reason NO TRANSFUSIONS OR PREGNANCY IN THE PAST 3 MONTHS   ABO/Rh     Status: None   Collection Time: 04/24/15 12:20 PM  Result Value Ref Range   ABO/RH(D) O POS     There is no height or weight on file to calculate BMI.   Imaging Review Plain radiographs demonstrate severe degenerative joint disease of the right hip(s). The bone quality appears to be fair for age and reported activity level.She has a high level of protrusio acetabulum  Assessment/Plan:  End stage arthritis, right hip(s)  The patient history, physical examination, clinical judgement of the provider and imaging studies are consistent with end stage degenerative joint disease of the right hip(s) and total hip arthroplasty is deemed medically necessary. The treatment options including medical management, injection therapy, arthroscopy and arthroplasty were discussed at length. The risks and benefits of total hip arthroplasty were presented and reviewed. The risks due to aseptic loosening,  infection, stiffness, dislocation/subluxation,  thromboembolic complications and other imponderables were discussed.  The patient acknowledged the explanation, agreed to proceed with the plan and consent was signed. Patient is being admitted for inpatient treatment for surgery, pain control, PT, OT, prophylactic antibiotics, VTE prophylaxis, progressive ambulation and ADL's and discharge planning.The patient is planning to be discharged home with home health services

## 2015-05-04 NOTE — Anesthesia Procedure Notes (Addendum)
Spinal Patient location during procedure: OR Start time: 05/04/2015 9:28 AM End time: 05/04/2015 9:30 AM Staffing Anesthesiologist: Suella Broad D Preanesthetic Checklist Completed: patient identified, site marked, surgical consent, pre-op evaluation, timeout performed, IV checked, risks and benefits discussed and monitors and equipment checked Spinal Block Patient position: sitting Prep: Betadine Patient monitoring: heart rate, continuous pulse ox, blood pressure and cardiac monitor Approach: midline Location: L4-5 Injection technique: single-shot Needle Needle type: Whitacre and Introducer  Needle gauge: 24 G Needle length: 9 cm Additional Notes Negative paresthesia. Negative blood return. Positive free-flowing CSF. Expiration date of kit checked and confirmed. Patient tolerated procedure well, without complications.    Procedure Name: MAC Date/Time: 05/04/2015 9:55 AM Performed by: Lavell Luster Pre-anesthesia Checklist: Patient identified, Emergency Drugs available, Suction available, Patient being monitored and Timeout performed Patient Re-evaluated:Patient Re-evaluated prior to inductionOxygen Delivery Method: Circle system utilized Preoxygenation: Pre-oxygenation with 100% oxygen Intubation Type: IV induction Ventilation: Nasal airway inserted- appropriate to patient size Placement Confirmation: positive ETCO2 and breath sounds checked- equal and bilateral Dental Injury: Teeth and Oropharynx as per pre-operative assessment

## 2015-05-04 NOTE — Transfer of Care (Signed)
Immediate Anesthesia Transfer of Care Note  Patient: April GibbonsSharon Diver  Procedure(s) Performed: Procedure(s): TOTAL HIP ARTHROPLASTY ANTERIOR APPROACH (Right)  Patient Location: PACU  Anesthesia Type:General  Level of Consciousness: awake, alert  and oriented  Airway & Oxygen Therapy: Patient connected to face mask oxygen  Post-op Assessment: Report given to RN  Post vital signs: stable  Last Vitals:  Filed Vitals:   05/04/15 0716  BP: 144/74  Pulse: 92  Temp: 36.7 C  Resp: 20    Complications: No apparent anesthesia complications

## 2015-05-04 NOTE — Discharge Instructions (Signed)

## 2015-05-04 NOTE — Anesthesia Preprocedure Evaluation (Addendum)
Anesthesia Evaluation  Patient identified by MRN, date of birth, ID band Patient awake    Reviewed: Allergy & Precautions, NPO status , Patient's Chart, lab work & pertinent test results  Airway Mallampati: I  TM Distance: >3 FB Neck ROM: Full    Dental  (+) Teeth Intact   Pulmonary neg pulmonary ROS,    breath sounds clear to auscultation       Cardiovascular hypertension, Pt. on medications  Rhythm:Regular Rate:Normal     Neuro/Psych negative neurological ROS  negative psych ROS   GI/Hepatic negative GI ROS, Neg liver ROS,   Endo/Other  diabetes, Type 2, Insulin Dependent, Oral Hypoglycemic Agents  Renal/GU negative Renal ROS  negative genitourinary   Musculoskeletal  (+) Arthritis ,   Abdominal   Peds negative pediatric ROS (+)  Hematology negative hematology ROS (+)   Anesthesia Other Findings   Reproductive/Obstetrics negative OB ROS                         Lab Results  Component Value Date   WBC 10.9* 04/24/2015   HGB 14.3 04/24/2015   HCT 44.5 04/24/2015   MCV 79.7 04/24/2015   PLT 329 04/24/2015   Lab Results  Component Value Date   INR 0.94 04/24/2015   04/2015: EKG: normal sinus rhythm.    Anesthesia Physical Anesthesia Plan  ASA: II  Anesthesia Plan: Spinal   Post-op Pain Management:    Induction: Intravenous  Airway Management Planned: Natural Airway and Nasal Cannula  Additional Equipment:   Intra-op Plan:   Post-operative Plan:   Informed Consent: I have reviewed the patients History and Physical, chart, labs and discussed the procedure including the risks, benefits and alternatives for the proposed anesthesia with the patient or authorized representative who has indicated his/her understanding and acceptance.   Dental advisory given  Plan Discussed with: CRNA  Anesthesia Plan Comments:         Anesthesia Quick Evaluation

## 2015-05-04 NOTE — Anesthesia Postprocedure Evaluation (Signed)
Anesthesia Post Note  Patient: Marvetta GibbonsSharon Christofferson  Procedure(s) Performed: Procedure(s) (LRB): TOTAL HIP ARTHROPLASTY ANTERIOR APPROACH (Right)  Patient location during evaluation: PACU Anesthesia Type: Spinal Level of consciousness: oriented and awake and alert Pain management: pain level controlled Vital Signs Assessment: post-procedure vital signs reviewed and stable Respiratory status: spontaneous breathing, respiratory function stable and patient connected to nasal cannula oxygen Cardiovascular status: blood pressure returned to baseline and stable Postop Assessment: no headache, no backache and spinal receding Anesthetic complications: no    Last Vitals:  Filed Vitals:   05/04/15 1320 05/04/15 1330  BP: 156/77 155/82  Pulse: 74 72  Temp:    Resp: 13 16    Last Pain:  Filed Vitals:   05/04/15 1342  PainSc: 2                  Shelton SilvasKevin D Milderd Manocchio

## 2015-05-05 LAB — CBC
HEMATOCRIT: 33 % — AB (ref 36.0–46.0)
Hemoglobin: 10.7 g/dL — ABNORMAL LOW (ref 12.0–15.0)
MCH: 25.7 pg — AB (ref 26.0–34.0)
MCHC: 32.4 g/dL (ref 30.0–36.0)
MCV: 79.3 fL (ref 78.0–100.0)
Platelets: 270 10*3/uL (ref 150–400)
RBC: 4.16 MIL/uL (ref 3.87–5.11)
RDW: 15.3 % (ref 11.5–15.5)
WBC: 12 10*3/uL — ABNORMAL HIGH (ref 4.0–10.5)

## 2015-05-05 LAB — BASIC METABOLIC PANEL
Anion gap: 8 (ref 5–15)
BUN: 11 mg/dL (ref 6–20)
CALCIUM: 9 mg/dL (ref 8.9–10.3)
CHLORIDE: 106 mmol/L (ref 101–111)
CO2: 25 mmol/L (ref 22–32)
CREATININE: 0.61 mg/dL (ref 0.44–1.00)
GFR calc non Af Amer: >60 mL/min (ref >60)
Glucose, Bld: 61 mg/dL — ABNORMAL LOW (ref 65–99)
Potassium: 4.4 mmol/L (ref 3.5–5.1)
SODIUM: 139 mmol/L (ref 135–145)

## 2015-05-05 LAB — GLUCOSE, CAPILLARY
GLUCOSE-CAPILLARY: 85 mg/dL (ref 65–99)
Glucose-Capillary: 54 mg/dL — ABNORMAL LOW (ref 65–99)
Glucose-Capillary: 97 mg/dL (ref 65–99)

## 2015-05-05 NOTE — Care Management Note (Addendum)
Case Management Note  Patient Details  Name: Marvetta GibbonsSharon Galanti MRN: [id number] Date of Birth: 07/02/1960  Subjective/Objective:  55 yr old female s/p right total hip arthroplasty.                  Action/Plan:   Case manager spoke with patient at the bedside conerning home health and DME needs. Choice was offered. Referral was called to Fara Chuteiffany Clayton, [facility] liaison. DME has been ordered. Patient states she will have family support at discharge.   Expected Discharge Date:    05/05/15             Expected Discharge Plan:  Home w Home Health Services  In-House Referral:     Discharge planning Services  CM Consult  Post Acute Care Choice:  Durable Medical Equipment, Home Health Choice offered to:  Patient  DME Arranged:  3-N-1, Walker rolling DME Agency:  [facility]  [facility]  Status of Service:  Completed, signed off  Medicare Important Message Given:    Date Medicare IM Given:    Medicare IM give by:    Date Additional Medicare IM Given:    Additional Medicare Important Message give by:     If discussed at Long Length of Stay Meetings, dates discussed:    Additional Comments:  Durenda GuthrieBrady, Lacresia Darwish Naomi, RN 05/05/2015, 12:03 PM

## 2015-05-05 NOTE — Progress Notes (Signed)
Hypoglycemic Event  CBG: 54  Treatment: 15 GM carbohydrate snack  Symptoms: None  Follow-up CBG: Time: 0750 CBG Result: 85  Possible Reasons for Event: Inadequate meal intake  Comments/MD notified: no    Veto KempsWilson, Darelle Kings R

## 2015-05-05 NOTE — Evaluation (Signed)
Occupational Therapy Evaluation Patient Details Name: April GibbonsSharon Townsend MRN: [id number] DOB: 01/12/1961 Today's Date: 05/05/2015    History of Present Illness 55 y.o. admitted for R THA direct anterior approach. PMHx: right knee arthroscopy, DM, arthritis, DKA, Lichen planus atrophicus.   Clinical Impression   Pt s/p above. Education provided in session and OT signing off.    Follow Up Recommendations  No OT follow up;Supervision - Intermittent    Equipment Recommendations  3 in 1 bedside comode;Other (comment) (AE if wanted)    Recommendations for Other Services       Precautions / Restrictions Precautions Precautions: Fall Restrictions Weight Bearing Restrictions: Yes RLE Weight Bearing: Weight bearing as tolerated      Mobility Bed Mobility     General bed mobility comments: not assessed  Transfers Overall transfer level: Needs assistance   Transfers: Sit to/from Stand Sit to Stand: Supervision (and set up for RW)         General transfer comment: RW in front for sit to stand from chair. Cue for hand placement.    Balance      Unsteady with ambulation without use of RW.                                      ADL Overall ADL's : Needs assistance/impaired                     Lower Body Dressing: Sit to/from stand;With adaptive equipment;Minimal assistance   Toilet Transfer: Set up;Supervision/safety;Ambulation;RW (set up for RW; sit to stand from chair)           Functional mobility during ADLs:  (Min guard without RW; Setup and Supervision with RW) General ADL Comments: Educated on AE. Discussed tub transfer techniques. Educated on options for shower chair. Educated on safety such as use of bag on walker, rugs/items on floor, and sitting for LB ADLs. Recommended someone be with her for shower transfer. Pt plans to sponge bathe for now and did not want to practice tub or shower transfer.      Vision     Perception     Praxis       Pertinent Vitals/Pain Pain Assessment: 0-10 Pain Score: 7  Pain Location: right hip Pain Descriptors / Indicators: Sore Pain Intervention(s): Monitored during session;Ice applied;Repositioned     Hand Dominance     Extremity/Trunk Assessment Upper Extremity Assessment Upper Extremity Assessment: Overall WFL for tasks assessed   Lower Extremity Assessment Lower Extremity Assessment: Defer to PT evaluation RLE Deficits / Details: decreased strength and ROM as expected post op   Cervical / Trunk Assessment Cervical / Trunk Assessment: Normal   Communication Communication Communication: No difficulties   Cognition Arousal/Alertness: Awake/alert Behavior During Therapy: WFL for tasks assessed/performed Overall Cognitive Status: Within Functional Limits for tasks assessed                     General Comments       Exercises       Shoulder Instructions      Home Living Family/patient expects to be discharged to:: Private residence Living Arrangements: Spouse/significant other Available Help at Discharge: Family;Available 24 hours/day Type of Home: House Home Access: Level entry     Home Layout: One level     Bathroom Shower/Tub: Tub/shower unit;Walk-in shower (curtain on tub/shower )   Bathroom Toilet: Standard  Home Equipment: None          Prior Functioning/Environment Level of Independence: Independent             OT Diagnosis: Acute pain   OT Problem List: Decreased strength;Decreased range of motion;Decreased activity tolerance;Impaired balance (sitting and/or standing);Pain   OT Treatment/Interventions:      OT Goals(Current goals can be found in the care plan section) Acute Rehab OT Goals Patient Stated Goal: hope to go home  OT Frequency:     Barriers to D/C:            Co-evaluation              End of Session Equipment Utilized During Treatment: Gait belt;Rolling walker;Other (comment) (AE)  Activity  Tolerance: Patient tolerated treatment well Patient left: in chair;with call bell/phone within reach;with chair alarm set;with family/visitor present   Time: 1610-9604 OT Time Calculation (min): 21 min Charges:  OT General Charges $OT Visit: 1 Procedure OT Evaluation $OT Eval Moderate Complexity: 1 Procedure G-CodesEarlie Raveling OTR/L [phone] 05/05/2015, 10:54 AM

## 2015-05-05 NOTE — Discharge Summary (Signed)
Patient ID: April GibbonsSharon Rapley MRN: [id number] DOB/AGE: 56/04/1960 56 y.o.  Admit date: 05/04/2015 Discharge date: 05/05/2015  Admission Diagnoses:  Principal Problem:   Primary osteoarthritis of right hip   Discharge Diagnoses:  Same  Past Medical History  Diagnosis Date  . Arthritis   . Lichen planus atrophicus   . Diabetes mellitus without complication (HCC)     Type II  . DKA (diabetic ketoacidoses) (HCC) 11/20/14    Surgeries: Procedure(s): TOTAL HIP ARTHROPLASTY ANTERIOR APPROACH on 05/04/2015   Consultants:    Discharged Condition: Improved  Hospital Course: April Townsend is an 55 y.o. female who was admitted 05/04/2015 for operative treatment ofPrimary osteoarthritis of right hip. Patient has severe unremitting pain that affects sleep, daily activities, and work/hobbies. After pre-op clearance the patient was taken to the operating room on 05/04/2015 and underwent  Procedure(s): TOTAL HIP ARTHROPLASTY ANTERIOR APPROACH.    Patient was given perioperative antibiotics: Anti-infectives    Start     Dose/Rate Route Frequency Ordered Stop   05/04/15 1530  clindamycin (CLEOCIN) IVPB 600 mg     600 mg 100 mL/hr over 30 Minutes Intravenous Every 6 hours 05/04/15 1426 05/04/15 2200   05/04/15 0900  clindamycin (CLEOCIN) IVPB 900 mg     900 mg 100 mL/hr over 30 Minutes Intravenous To ShortStay Surgical 05/03/15 1012 05/04/15 0952       Patient was given sequential compression devices, early ambulation, and chemoprophylaxis to prevent DVT.  Patient benefited maximally from hospital stay and there were no complications.    Recent vital signs: Patient Vitals for the past 24 hrs:  BP Temp Temp src Pulse Resp SpO2  05/05/15 0500 118/62 mmHg 99.6 F (37.6 C) Oral 96 18 100 %  05/05/15 0138 119/64 mmHg 99.4 F (37.4 C) Oral 96 18 100 %  05/04/15 2153 117/61 mmHg 99.1 F (37.3 C) Oral 90 18 100 %  05/04/15 1402 (!) 150/68 mmHg 98 F (36.7 C) Oral 73 16 100 %  05/04/15 1330 (!)  155/82 mmHg - - 72 16 100 %  05/04/15 1320 (!) 156/77 mmHg - - 74 13 100 %  05/04/15 1305 (!) 157/102 mmHg - - 80 16 100 %  05/04/15 1255 (!) 155/107 mmHg - - 94 16 100 %  05/04/15 1250 (!) 137/117 mmHg - - 99 18 100 %  05/04/15 1235 120/84 mmHg 97.8 F (36.6 C) - 96 17 100 %     Recent laboratory studies:  Recent Labs  05/05/15 0603  WBC 12.0*  HGB 10.7*  HCT 33.0*  PLT 270     Discharge Medications:     Medication List    TAKE these medications        aspirin EC 325 MG tablet  Take 1 tablet (325 mg total) by mouth 2 (two) times daily after a meal. Take x 1 month post op to decrease risk of blood clots.     FARXIGA 10 MG Tabs tablet  Generic drug:  dapagliflozin propanediol  Take 10 mg by mouth daily.     lisinopril 20 MG tablet  Commonly known as:  PRINIVIL,ZESTRIL  Take 20 mg by mouth daily.     oxyCODONE-acetaminophen 5-325 MG tablet  Commonly known as:  PERCOCET/ROXICET  Take 1-2 tablets by mouth every 4 (four) hours as needed for severe pain.     sitaGLIPtin 100 MG tablet  Commonly known as:  JANUVIA  Take 100 mg by mouth daily.     tiZANidine 2 MG tablet  Commonly  known as:  ZANAFLEX  Take 1 tablet (2 mg total) by mouth every 8 (eight) hours as needed for muscle spasms.     TOUJEO SOLOSTAR 300 UNIT/ML Sopn  Generic drug:  Insulin Glargine  Inject 76 Units into the skin daily.        Diagnostic Studies: Dg Chest 2 View  05/04/2015  CLINICAL DATA:  Hypertension, diabetes, preop evaluation for right hip surgery EXAM: CHEST  2 VIEW COMPARISON:  Unavailable FINDINGS: Normal heart size and vascularity. Lungs remain clear. No focal pneumonia, collapse or consolidation. No edema, effusion or pneumothorax. Mild scoliosis of the spine evident. IMPRESSION: No acute chest process. Electronically Signed   By: Judie Petit.  Shick M.D.   On: 05/04/2015 07:56   Dg Hip Operative Unilat W Or W/o Pelvis Right  05/04/2015  CLINICAL DATA:  Right hip replacement EXAM: OPERATIVE  RIGHT HIP (WITH PELVIS IF PERFORMED) 2 VIEWS TECHNIQUE: Fluoroscopic spot image(s) were submitted for interpretation post-operatively. COMPARISON:  None. FINDINGS: Two intraoperative spot images demonstrate changes of right hip replacement. Normal AP alignment. No hardware or bony complicating feature noted. Mild protrusio. IMPRESSION: Right hip replacement without visible complicating feature. Electronically Signed   By: Charlett Nose M.D.   On: 05/04/2015 12:03    Disposition: Final discharge disposition not confirmed      Discharge Instructions    Call MD / Call 911    Complete by:  As directed   If you experience chest pain or shortness of breath, CALL 911 and be transported to the hospital emergency room.  If you develope a fever above 101 F, pus (white drainage) or increased drainage or redness at the wound, or calf pain, call your surgeon's office.     Constipation Prevention    Complete by:  As directed   Drink plenty of fluids.  Prune juice may be helpful.  You may use a stool softener, such as Colace (over the counter) 100 mg twice a day.  Use MiraLax (over the counter) for constipation as needed.     Diet - low sodium heart healthy    Complete by:  As directed      Driving restrictions    Complete by:  As directed   No driving for 2 weeks     Follow the hip precautions as taught in Physical Therapy    Complete by:  As directed      Increase activity slowly as tolerated    Complete by:  As directed      Patient may shower    Complete by:  As directed   You may shower without a dressing once there is no drainage.  Do not wash over the wound.  If drainage remains, cover wound with plastic wrap and then shower.     Weight bearing as tolerated    Complete by:  As directed   Laterality:  right  Extremity:  Lower           Follow-up Information    Follow up with GRAVES,JOHN L, MD. Schedule an appointment as soon as possible for a visit in 2 weeks.   Specialty:  Orthopedic  Surgery   Contact information:   [address] [place] [address] [phone]        Signed: Vear Clock Marlei Glomski R 05/05/2015, 8:23 AM

## 2015-05-05 NOTE — Evaluation (Signed)
Physical Therapy Evaluation Patient Details Name: April Townsend MRN: [id number] DOB: [birth year] Today's Date: 05/05/2015   History of Present Illness  54yo admitted for R THA anterior approach. PMHx: right knee arthroscopy, DM, OA  Clinical Impression  Pt moving well able to ambulate in hall, perform standing HEP, and transfers without physical assist. Pt with decreased strength, ROM, gait and mobility from baseline who will benefit from acute therapy to maximize strength, function, transfers and gait to decrease burden of care.     Follow Up Recommendations Home health PT    Equipment Recommendations  3in1 (PT);Rolling walker with 5" wheels    Recommendations for Other Services       Precautions / Restrictions Precautions Precautions: None      Mobility  Bed Mobility Overal bed mobility: Modified Independent                Transfers Overall transfer level: Needs assistance   Transfers: Sit to/from Stand Sit to Stand: Supervision         General transfer comment: cues for hand placement  Ambulation/Gait Ambulation/Gait assistance: Supervision Ambulation Distance (Feet): 120 Feet Assistive device: Rolling walker (2 wheeled) Gait Pattern/deviations: Step-through pattern;Decreased stride length   Gait velocity interpretation: Below normal speed for age/gender General Gait Details: cues for posture, position in RW and sequence to decrease reliance on upper body  Stairs            Wheelchair Mobility    Modified Rankin (Stroke Patients Only)       Balance                                             Pertinent Vitals/Pain Pain Assessment: 0-10 Pain Score: 2  Pain Location: right hip Pain Descriptors / Indicators: Sore Pain Intervention(s): Limited activity within patient's tolerance;Monitored during session;Premedicated before session;Repositioned;Ice applied    Home Living Family/patient expects to be discharged to:: Private  residence Living Arrangements: Spouse/significant other Available Help at Discharge: Family;Available 24 hours/day Type of Home: House Home Access: Level entry     Home Layout: One level Home Equipment: None      Prior Function Level of Independence: Independent               Hand Dominance        Extremity/Trunk Assessment   Upper Extremity Assessment: Overall WFL for tasks assessed           Lower Extremity Assessment: RLE deficits/detail RLE Deficits / Details: decreased strength and ROM as expected post op    Cervical / Trunk Assessment: Normal  Communication   Communication: No difficulties  Cognition Arousal/Alertness: Awake/alert Behavior During Therapy: WFL for tasks assessed/performed Overall Cognitive Status: Within Functional Limits for tasks assessed                      General Comments      Exercises Total Joint Exercises Hip ABduction/ADduction: AROM;Standing;Right;10 reps Knee Flexion: AROM;Right;10 reps;Supine Marching in Standing: AROM;Right;10 reps;Standing Standing Hip Extension: AROM;Right;10 reps;Standing      Assessment/Plan    PT Assessment Patient needs continued PT services  PT Diagnosis Difficulty walking;Acute pain   PT Problem List Decreased strength;Decreased range of motion;Decreased activity tolerance;Decreased mobility;Pain;Decreased knowledge of use of DME  PT Treatment Interventions DME instruction;Gait training;Functional mobility training;Therapeutic activities;Therapeutic exercise;Patient/family education   PT Goals (Current goals can be found  in the Care Plan section) Acute Rehab PT Goals Patient Stated Goal: return home PT Goal Formulation: With patient Time For Goal Achievement: 05/12/15 Potential to Achieve Goals: Good    Frequency 7X/week   Barriers to discharge        Co-evaluation               End of Session Equipment Utilized During Treatment: Gait belt Activity Tolerance:  Patient tolerated treatment well Patient left: in chair;with call bell/phone within reach;with chair alarm set Nurse Communication: Mobility status         Time: [id number] PT Time Calculation (min) (ACUTE ONLY): 20 min   Charges:   PT Evaluation $PT Eval Moderate Complexity: 1 Procedure     PT G CodesDelorse Lek:        Tabor, Namari Breton Beth 05/05/2015, 10:00 AM Delaney MeigsMaija Tabor Rhonna Holster, PT [phone]

## 2015-05-05 NOTE — Progress Notes (Signed)
PATIENT ID: April GibbonsSharon Townsend  MRN: [id number]  DOB/AGE:  03/22/1960 / 55 y.o.  1 Day Post-Op Procedure(s) (LRB): TOTAL HIP ARTHROPLASTY ANTERIOR APPROACH (Right)    PROGRESS NOTE Subjective: Patient is alert, oriented, no Nausea, no Vomiting, yes passing gas, . Taking PO well. Denies SOB, Chest or Calf Pain. Using Incentive Spirometer, PAS in place. Ambulate WBAT Patient reports pain as  2/10  .    Objective: Vital signs in last 24 hours: Filed Vitals:   05/04/15 1402 05/04/15 2153 05/05/15 0138 05/05/15 0500  BP: 150/68 117/61 119/64 118/62  Pulse: 73 90 96 96  Temp: 98 F (36.7 C) 99.1 F (37.3 C) 99.4 F (37.4 C) 99.6 F (37.6 C)  TempSrc: Oral Oral Oral Oral  Resp: 16 18 18 18   Weight:      SpO2: 100% 100% 100% 100%      Intake/Output from previous day: I/O last 3 completed shifts: In: 1490 [P.O.:240; I.V.:1000; IV Piggyback:250] Out: 1450 [Urine:800; Blood:650]   Intake/Output this shift:     LABORATORY DATA:  Recent Labs  05/04/15 2151 05/05/15 0603 05/05/15 0631 05/05/15 0748  WBC  --  12.0*  --   --   HGB  --  10.7*  --   --   HCT  --  33.0*  --   --   PLT  --  270  --   --   GLUCAP 135*  --  54* 85    Examination: Neurologically intact Neurovascular intact Sensation intact distally Intact pulses distally Dorsiflexion/Plantar flexion intact Incision: dressing C/D/I No cellulitis present Compartment soft} XR AP&Lat of hip shows well placed\fixed THA  Assessment:   1 Day Post-Op Procedure(s) (LRB): TOTAL HIP ARTHROPLASTY ANTERIOR APPROACH (Right) ADDITIONAL DIAGNOSIS:  Expected Acute Blood Loss Anemia,  Plan: PT/OT WBAT, THA  DVT Prophylaxis: SCDx72 hrs, ASA 325 mg BID x 4 weeks  DISCHARGE PLAN: Home, when pt passes therapy goals.  DISCHARGE NEEDS: HHPT and Walker

## 2015-05-05 NOTE — Progress Notes (Signed)
Physical Therapy Progress Note Pt with continued improvement in gait, HEP and activity tolerance. Pt educated for home mobility, gait progression and HEP. All questions answered and pt safe for D/C home.    05/05/15 1200  PT Visit Information  Last PT Received On 05/05/15  Assistance Needed +1  History of Present Illness 55 y.o. admitted for R THA anterior approach. PMHx: right knee arthroscopy, DM, arthritis, DKA, Lichen planus atrophicus.  PT Time Calculation  PT Start Time (ACUTE ONLY) 1151  PT Stop Time (ACUTE ONLY) 1208  PT Time Calculation (min) (ACUTE ONLY) 17 min  Precautions  Precautions None  Restrictions  Weight Bearing Restrictions Yes  RLE Weight Bearing WBAT  Pain Assessment  Pain Score 2  Pain Location right hip  Pain Descriptors / Indicators Sore  Pain Intervention(s) Limited activity within patient's tolerance;Monitored during session;Repositioned;Ice applied  Cognition  Arousal/Alertness Awake/alert  Behavior During Therapy WFL for tasks assessed/performed  Overall Cognitive Status Within Functional Limits for tasks assessed  Transfers  Sit to Stand Modified independent (Device/Increase time)  Ambulation/Gait  Ambulation/Gait assistance Supervision  Ambulation Distance (Feet) 200 Feet  Assistive device Rolling walker (2 wheeled)  Gait Pattern/deviations Step-through pattern;Decreased stride length  General Gait Details cues for posture, position in RW and sequence to decrease reliance on upper body  Gait velocity interpretation Below normal speed for age/gender  Exercises  Exercises Total Joint  Total Joint Exercises  Hip ABduction/ADduction AROM;Standing;Right;10 reps  Knee Flexion Standing;AROM;Right;10 reps  Marching in Standing AROM;Right;10 reps;Standing  Standing Hip Extension AROM;Right;10 reps;Standing  Long Arc Quad AROM;Right;10 reps;Seated  PT - End of Session  Equipment Utilized During Treatment Gait belt  Activity Tolerance Patient tolerated  treatment well  Patient left in chair;with call bell/phone within reach  PT - Assessment/Plan  PT Plan Current plan remains appropriate  Follow Up Recommendations Home health PT  PT equipment 3in1 (PT);Rolling walker with 5" wheels  PT Goal Progression  Progress towards PT goals Progressing toward goals  PT General Charges  $$ ACUTE PT VISIT 1 Procedure  PT Treatments  $Gait Training 8-22 mins  Delaney MeigsMaija Tabor Teddi Badalamenti, PT [phone]

## 2015-05-07 ENCOUNTER — Encounter (HOSPITAL_COMMUNITY): Payer: Self-pay | Admitting: Orthopedic Surgery

## 2015-05-07 NOTE — Brief Op Note (Signed)
05/04/2015  3:20 PM  PATIENT:  April Townsend  55 y.o. female  PRE-OPERATIVE DIAGNOSIS:  Osteoarthritis right hip  POST-OPERATIVE DIAGNOSIS:  Osteoarthritis right hip  PROCEDURE:  Procedure(s): TOTAL HIP ARTHROPLASTY ANTERIOR APPROACH (Right)  SURGEON:  Surgeon(s) and Role:    * Jodi GeraldsJohn Amal Saiki, MD - Primary  PHYSICIAN ASSISTANT:   ASSISTANTS: bethune   ANESTHESIA:   general  EBL:     BLOOD ADMINISTERED:none  DRAINS: none   LOCAL MEDICATIONS USED:  OTHER experel  SPECIMEN:  No Specimen  DISPOSITION OF SPECIMEN:  N/A  COUNTS:  YES  TOURNIQUET:  * No tourniquets in log *  DICTATION: .Other Dictation: Dictation Number [id number]  PLAN OF CARE: Admit to inpatient   PATIENT DISPOSITION:  PACU - hemodynamically stable.   Delay start of Pharmacological VTE agent (>24hrs) due to surgical blood loss or risk of bleeding: no

## 2015-05-08 NOTE — Op Note (Signed)
NAMEAEMILIA, DEDRICK NO.:  [id number]  MEDICAL RECORD NO.:  [id number]  LOCATION:  [id number]                        FACILITY:  [facility]  PHYSICIAN:  Harvie Junior, M.D.   DATE OF BIRTH:  03-May-1960  DATE OF PROCEDURE:  05/04/2015 DATE OF DISCHARGE:  05/05/2015                              OPERATIVE REPORT   PREOPERATIVE DIAGNOSIS:  End-stage degenerative joint disease, right hip with severe protrusio acetabulum.  POSTOPERATIVE DIAGNOSIS:  End-stage degenerative joint disease, right hip with severe protrusio acetabulum.  PROCEDURES: 1. Open reduction and internal fixation of right hip with a Sector     Pinnacle Gription cup, size 50, a +5 36-mm Delta ceramic hip ball,     a 36-mm standard neutral liner and a Corail stem with high offset. 2. Interpretation of multiple intraoperative fluoroscopic images. 3. Bone grafting of the severe protrusio acetabular defect with     remnants of the femoral head.  SURGEON:  Harvie Junior, M.D.  ASSISTANT:  Marshia Ly, P.A.  ANESTHESIA:  General.  BRIEF HISTORY:  April Townsend is a 55 year old female with a long history of significant complaints of right hip pain.  She had been treated conservatively for prolonged period of time.  X-ray showed a severe protrusio acetabulum and we had long discussion about treatment options. We felt that total hip replacement will be appropriate and after failure of all conservative care, she was taken to the operating room for hip replacement because of light activity pain and night pain, which had been unrelenting.  We had a long discussion preoperatively and knew this would be extremely difficult case and we did a significant amount of preoperative planning to make sure that we could adequately treat her pathology.  She was brought to the operating room for this procedure.  DESCRIPTION OF PROCEDURE:  The patient was brought to the operating room.  After adequate anesthesia was obtained  with general anesthetic, the patient was placed supine on the operating table.  The patient was then moved onto the Hana bed and the right hip was prepped and draped in usual sterile fashion.  Following this, an incision was made for an anterior approach to the hip, subcutaneous tissue down the level to the tensor fascia, which was clearly identified.  An incision was made in the tensor fascia and the retractors were put in place above and below the neck.  Once this was done, a provisional neck cut was made and the attention was turned towards taking out the femoral head.  Because of the severe protrusio nature of the acetabulum, it was very difficult to remove this.  Once this was done, retractors were put in place above and below the neck and at this point, we brought in a reamer about the size of the femoral head, set the reamer in the lateral opening and anteversion that we wanted and then just began to ream only where this was necessary.  This was left a large cavitary defect medially, we knew this would be.  We curetted this area to make the bone bleed in that area and take all soft tissue off there and we just sequentially reamed up to a  level of 49 mm and got excellent rim lock here and then did a trial 50-mm cup and then replaced that with a Sector Gription cup. Prior to placing the Sector Gription cup, we went to the back table and morselized up the femoral head with a reamer and put all of this bone graft on the medial side, then reversed reamed to a 49-mm before placing a 50-mm Sector Gription cup.  Once we placed the cup, there was such an excellent rim lock, there was no need for supplemental screws.  We did put a hole eliminator into to hold some of our graft in place and then put a neutral liner in this area.  The bone grafting had done really nice that can see on x-ray, now this defect had been nicely filled in. Attention was then turned to the stem side.  With the hip was  put into an externally rotated, extended and adducted position, once this was done, attention was turned to the release of the posterior capsule and then we followed this with the sequential rasping, which rasped up to a level of size 11 and then put in a high-offset ball based on a preoperative anatomy and did a reduction, this put her in an excellent position in terms of leg length that was really little unhappy with the offset and the stability and at that point, we then moved to a +5 ball, which gave excellent offset and stability.  At this point, we removed the trial stem, placed our final Corail stem, and put a +5 hip ball on here and reduced the hip.  This gave us an anatomic reduction of her hip.  Final fluoroscopic images were taken, fluoro was used significantly throughout the case because of the need to get this cup put in the appropriate position with the appropriate lateral offset and anteversion.  Once we were satisfied that this had been accomplished, we were happy with the overall hip.  We also used it to make sure that the stem was the right size and the leg lengths were symmetric.  The case went technically quite well.  The estimated blood loss for the procedure was about 700 mL.  The patient was taken at this point to the recovery room, she was noted to be in satisfactory condition.  Of note, we spent several hours of preoperative planning and discussion with multiple arthroplasty surgeons for discussion of treatment of the cavitary defect and option should this become problematic. Intraoperatively, the case did take longer than usual because of the severe nature of her deformity.  Once we were satisfied that we had done an adequate job reduction, the patient's wound was irrigated and suctioned dry.  We closed the capsule with 1 Vicryl running, skin with 0 and 2-0 Vicryl, and 3-0 Monocryl.  Benzoin and Steri-Strips were applied.  Sterile compressive dressing was  applied and the patient was taken to the recovery room, she was noted to be in satisfactory condition.  Estimated blood loss for the procedure was 700 mL.     Harvie JuniorJohn L. Shaley Leavens, M.D.     Ranae PlumberJLG/MEDQ  D:  05/07/2015  T:  05/07/2015  Job:  [id number]

## 2017-02-04 IMAGING — CR DG CHEST 2V
2 series · 2 of 2 positions shown · non-contrast
Comparison: Unavailable

CLINICAL DATA: Hypertension, diabetes, preop evaluation for right
hip surgery

EXAM:
CHEST  2 VIEW

[w chest pa]
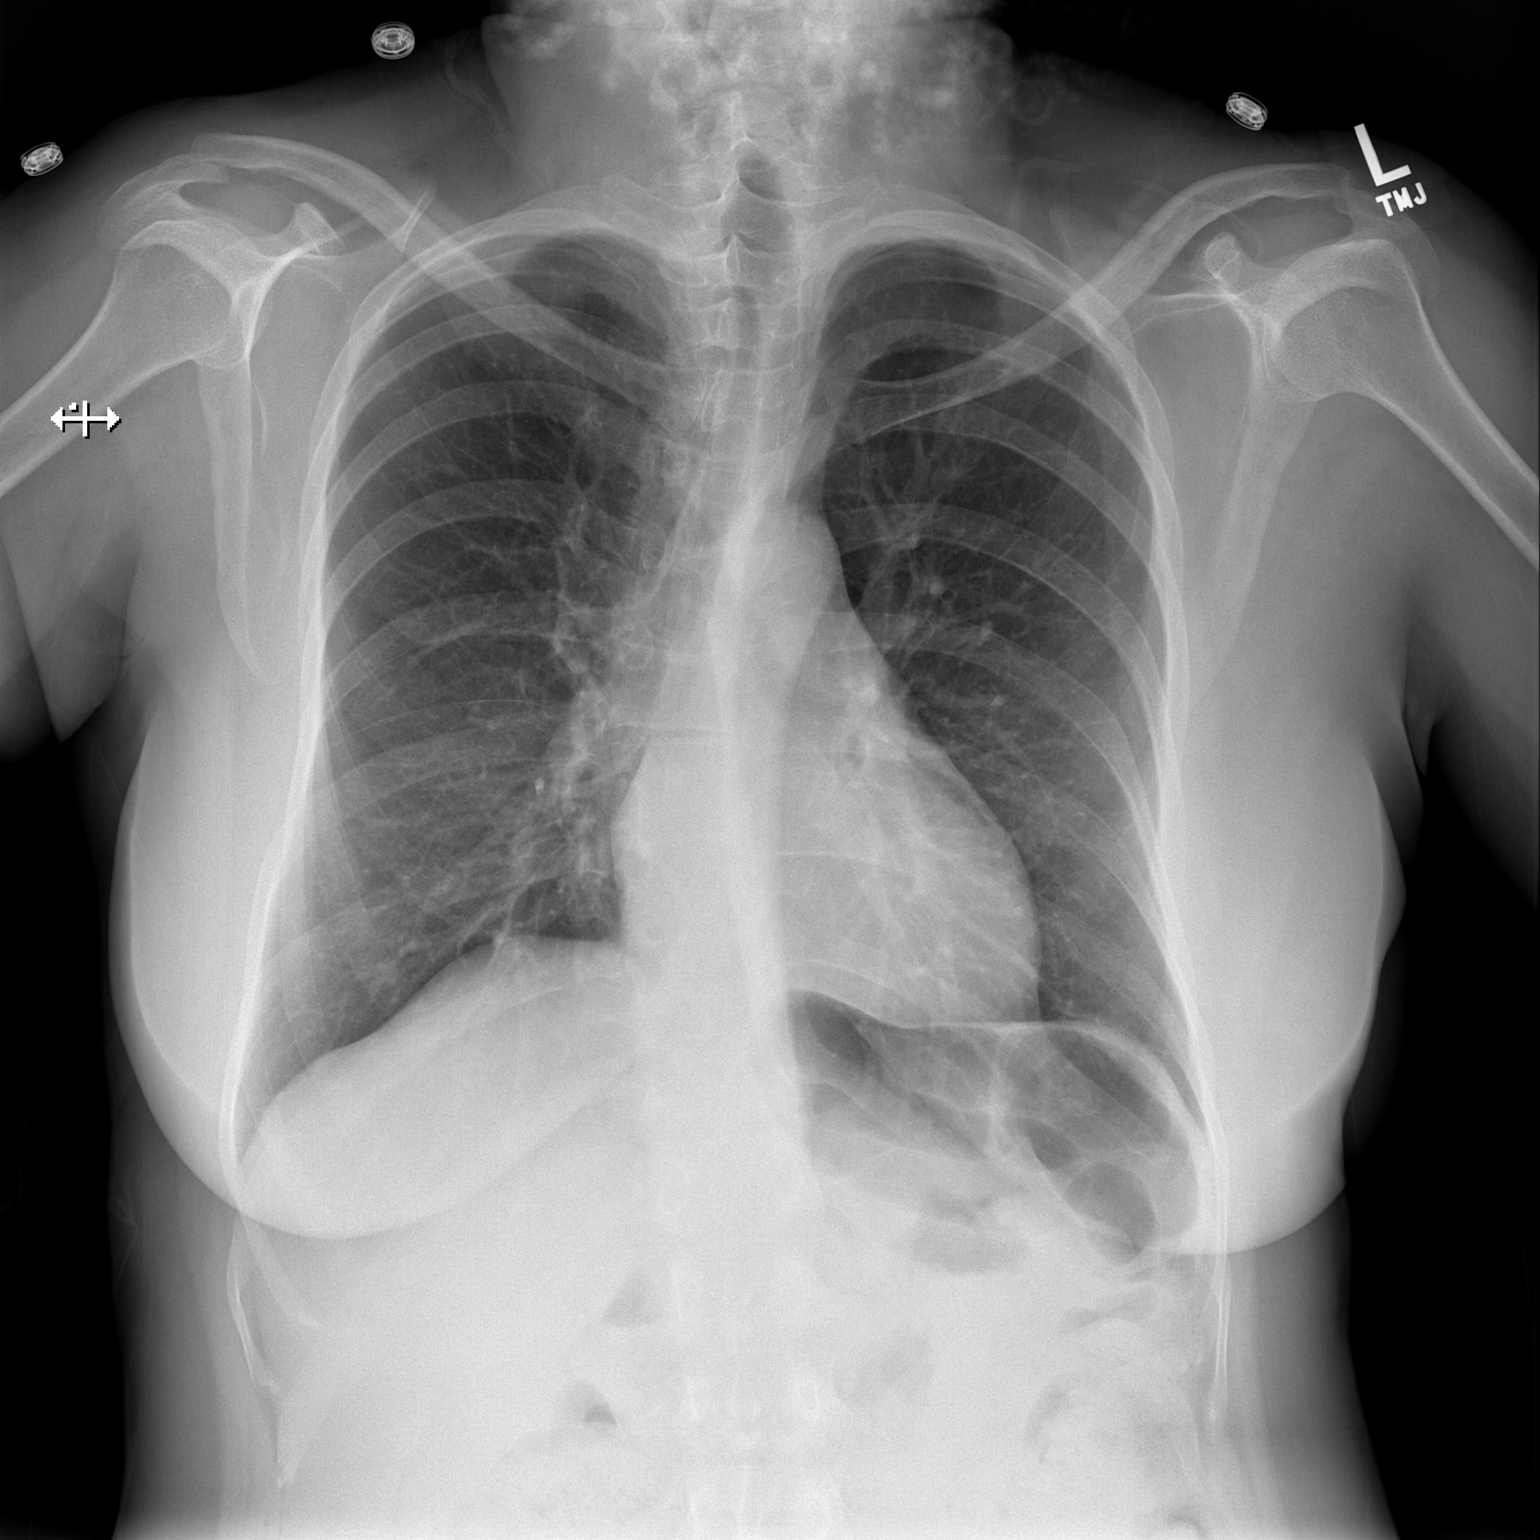

[w chest lat]
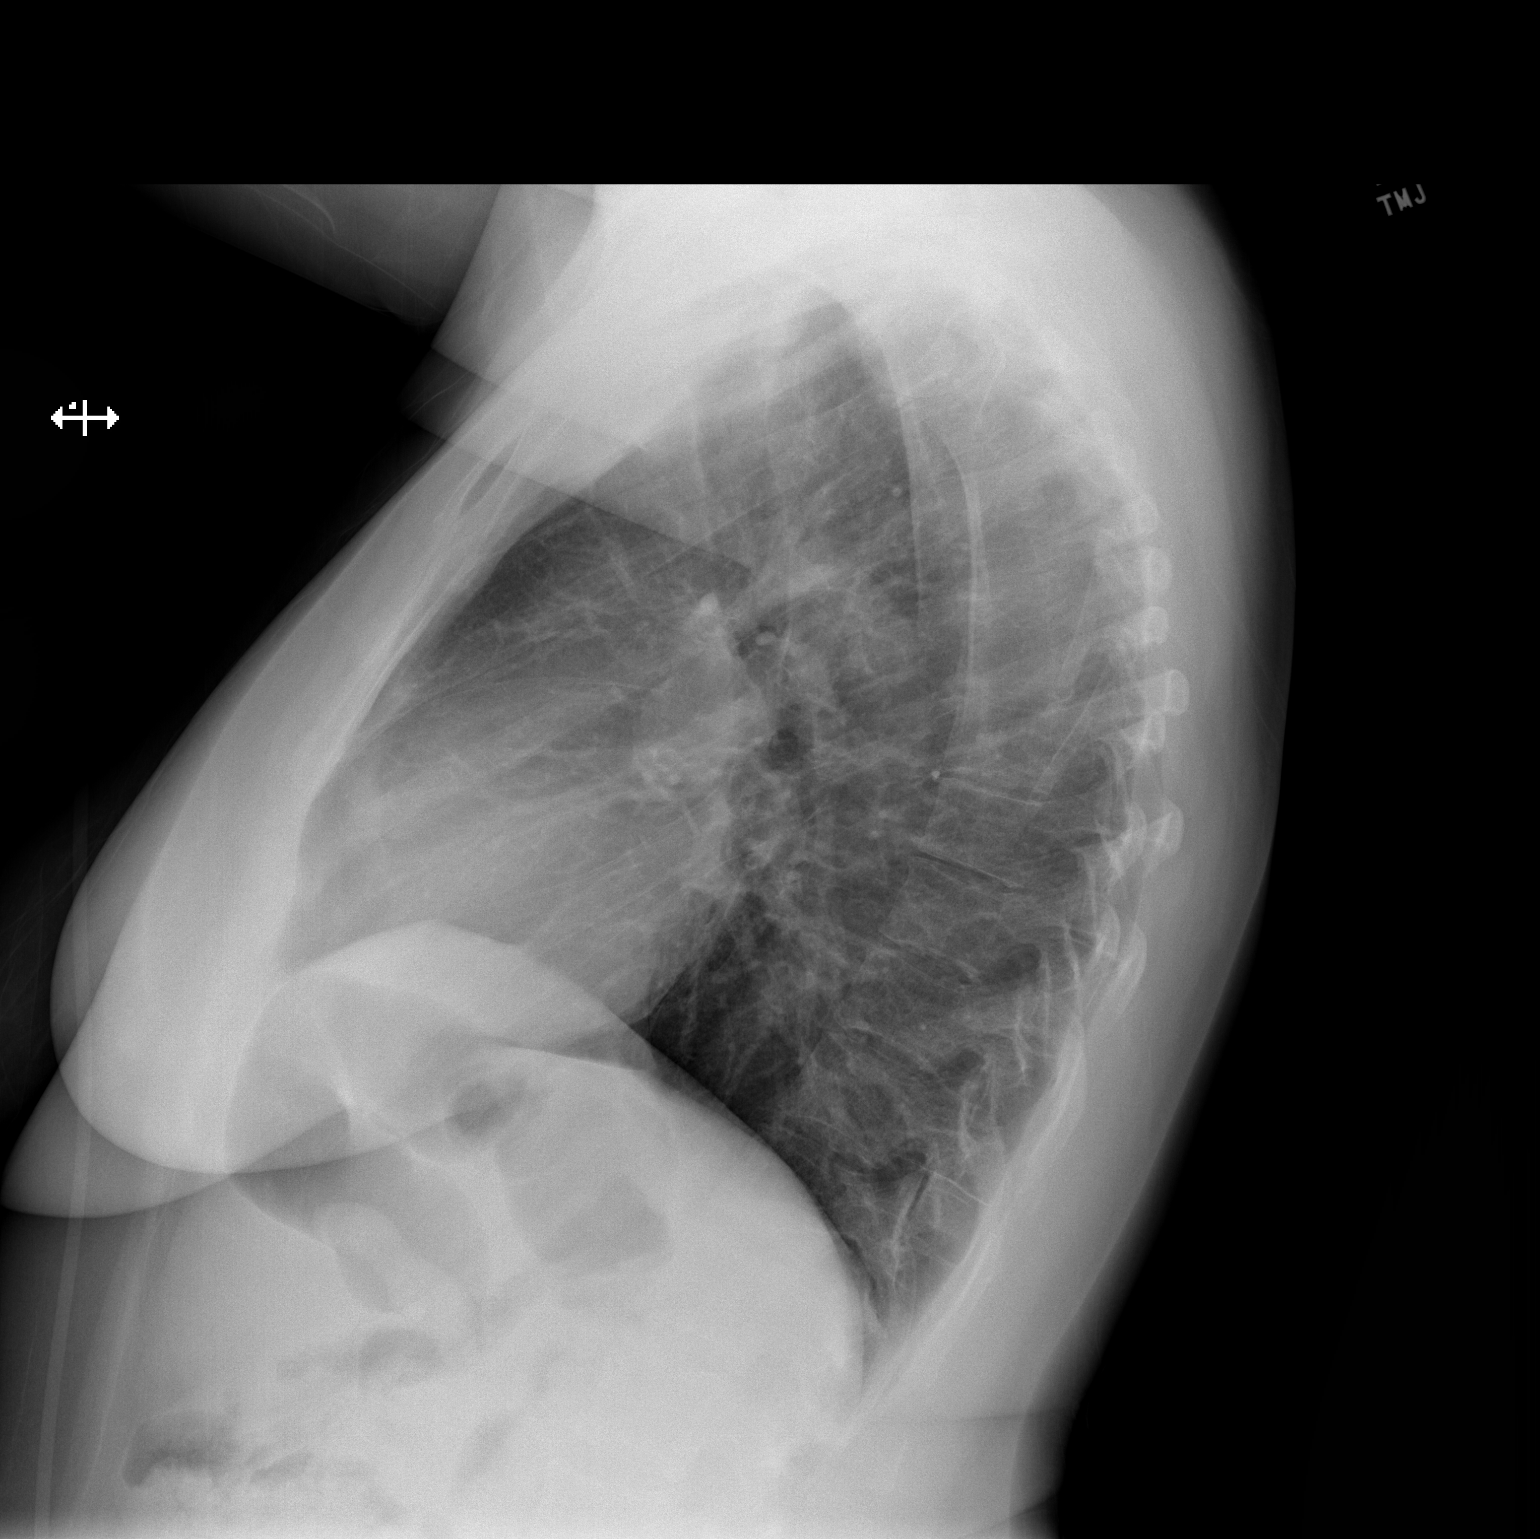

[2 of 2 positions shown; findings below may reference images not displayed]

FINDINGS: Normal heart size and vascularity. Lungs remain clear. No focal
pneumonia, collapse or consolidation. No edema, effusion or
pneumothorax. Mild scoliosis of the spine evident.
IMPRESSION: No acute chest process.

## 2024-02-02 ENCOUNTER — Ambulatory Visit: Admitting: Internal Medicine
